# Patient Record
Sex: Female | Born: 1970 | Race: White | Hispanic: No | Marital: Married | Smoking: Former smoker
Health system: Southern US, Community
[De-identification: ages and names within clinical notes are randomized; demographics above are authoritative.]

## PROBLEM LIST (undated history)

## (undated) DIAGNOSIS — T07XXXA Unspecified multiple injuries, initial encounter: Secondary | ICD-10-CM

## (undated) HISTORY — PX: MOLE REMOVAL: SHX2046

## (undated) HISTORY — DX: Unspecified multiple injuries, initial encounter: T07.XXXA

---

## 2006-01-25 ENCOUNTER — Ambulatory Visit: Payer: Self-pay | Admitting: Internal Medicine

## 2006-01-25 ENCOUNTER — Encounter (INDEPENDENT_AMBULATORY_CARE_PROVIDER_SITE_OTHER): Payer: Self-pay | Admitting: *Deleted

## 2018-03-14 ENCOUNTER — Encounter (HOSPITAL_COMMUNITY): Payer: Self-pay | Admitting: Emergency Medicine

## 2018-03-14 ENCOUNTER — Emergency Department (HOSPITAL_COMMUNITY)
Admission: EM | Admit: 2018-03-14 | Discharge: 2018-03-14 | Disposition: A | Discharge status: Home / Self Care | Payer: BC Managed Care – PPO | Attending: Emergency Medicine | Admitting: Emergency Medicine

## 2018-03-14 ENCOUNTER — Other Ambulatory Visit: Payer: Self-pay

## 2018-03-14 ENCOUNTER — Emergency Department (HOSPITAL_COMMUNITY): Payer: BC Managed Care – PPO

## 2018-03-14 DIAGNOSIS — Y929 Unspecified place or not applicable: Secondary | ICD-10-CM | POA: Diagnosis not present

## 2018-03-14 DIAGNOSIS — Z87891 Personal history of nicotine dependence: Secondary | ICD-10-CM | POA: Diagnosis not present

## 2018-03-14 DIAGNOSIS — S99921A Unspecified injury of right foot, initial encounter: Secondary | ICD-10-CM | POA: Diagnosis present

## 2018-03-14 DIAGNOSIS — Y999 Unspecified external cause status: Secondary | ICD-10-CM | POA: Insufficient documentation

## 2018-03-14 DIAGNOSIS — Y939 Activity, unspecified: Secondary | ICD-10-CM | POA: Insufficient documentation

## 2018-03-14 DIAGNOSIS — S92354A Nondisplaced fracture of fifth metatarsal bone, right foot, initial encounter for closed fracture: Secondary | ICD-10-CM | POA: Insufficient documentation

## 2018-03-14 DIAGNOSIS — X509XXA Other and unspecified overexertion or strenuous movements or postures, initial encounter: Secondary | ICD-10-CM | POA: Diagnosis not present

## 2018-03-14 MED ORDER — IBUPROFEN 600 MG PO TABS: 600.0000 mg | ORAL_TABLET | Freq: Four times a day (QID) | ORAL | 0 refills | Status: DC | PRN

## 2018-03-14 MED ORDER — HYDROCODONE-ACETAMINOPHEN 5-325 MG PO TABS: 1.0000 | ORAL_TABLET | Freq: Four times a day (QID) | ORAL | 0 refills | Status: DC | PRN

## 2018-03-14 NOTE — Discharge Instructions (Addendum)
Wear the boot at all time when standing and walking as discussed. Elevate and use ice for the next several days as much as possible to help minimize pain and swelling.  Take the ibuprofen as needed for pain.  You may also take the hydrocodone as prescribed for increased pain relief but this medicine will make you drowsy. Do not drive within 4 hours of taking this medicine.

## 2018-03-14 NOTE — ED Triage Notes (Signed)
Pt reports tripping today and now having R lateral foot pain and swelling.

## 2018-03-16 NOTE — ED Provider Notes (Signed)
[facility] EMERGENCY DEPARTMENT Provider Note   CSN: [id number] Arrival date & time: 03/14/18  1225     History   Chief Complaint Chief Complaint  Patient presents with  . Foot Pain    HPI Crystal Maldonado is a 47 y.o. female presenting with right foot pain which occurred suddenly when the patient tripped and fell, twisting the extremity.  Pain is aching, constant and worse with palpation, movement and weight bearing.  The patient was able to weight bear immediately after the event.  There is no radiation of pain and the patient denies numbness distal to the injury site.  She has had no treatment prior to arrival. .  The history is provided by the patient.    History reviewed. No pertinent past medical history.  There are no active problems to display for this patient.   History reviewed. No pertinent surgical history.   OB History   None      Home Medications    Prior to Admission medications   Medication Sig Start Date End Date Taking? Authorizing Provider  HYDROcodone-acetaminophen (NORCO/VICODIN) 5-325 MG tablet Take 1 tablet by mouth every 6 (six) hours as needed for moderate pain or severe pain. 03/14/18   Burgess AmorIdol, Linetta Regner, PA-C  ibuprofen (ADVIL,MOTRIN) 600 MG tablet Take 1 tablet (600 mg total) by mouth every 6 (six) hours as needed. 03/14/18   Burgess AmorIdol, Nathanial Arrighi, PA-C    Family History History reviewed. No pertinent family history.  Social History Social History   Tobacco Use  . Smoking status: Former Games developermoker  . Smokeless tobacco: Never Used  Substance Use Topics  . Alcohol use: Yes    Comment: q other day 1 drink  . Drug use: Not Currently     Allergies   Patient has no known allergies.   Review of Systems Review of Systems  Constitutional: Negative for fever.  Musculoskeletal: Positive for arthralgias and joint swelling. Negative for myalgias.  Skin: Negative for color change and wound.  Neurological: Negative for weakness and numbness.     Physical  Exam Updated Vital Signs BP 137/70 (BP Location: Right Arm)   Pulse 97   Temp 98.6 F (37 C) (Oral)   Resp 17   Ht 5\' 7"  (1.702 m)   Wt 81.6 kg   LMP 03/07/2018   SpO2 100%   BMI 28.19 kg/m   Physical Exam  Constitutional: She appears well-developed and well-nourished.  HENT:  Head: Atraumatic.  Neck: Normal range of motion.  Cardiovascular:  Pulses equal bilaterally  Musculoskeletal: She exhibits tenderness.       Right foot: There is bony tenderness and swelling. There is normal capillary refill, no crepitus and no deformity.       Feet:  ttp with edema right proximal 5th metatarsal. Distal sensation intact.  Less than 2 sec cap refills in toes.  Dorsalis pedis pulse full. No skin trauma.  Proximal fibula and ankle nontender.  Neurological: She is alert. She has normal strength. She displays normal reflexes. No sensory deficit.  Skin: Skin is warm and dry.  Psychiatric: She has a normal mood and affect.     ED Treatments / Results  Labs (all labs ordered are listed, but only abnormal results are displayed) Labs Reviewed - No data to display  EKG None  Radiology No results found for this or any previous visit. Dg Foot Complete Right  Result Date: 03/14/2018 CLINICAL DATA:  Fall. Tripped on curb. Pain and swelling in the lateral foot. EXAM: RIGHT  FOOT COMPLETE - 3+ VIEW COMPARISON:  None. FINDINGS: A nondisplaced avulsion fracture is present at the base of the fifth metatarsal. Associated soft tissue swelling is present. No additional fractures are present. No radiopaque foreign body is present. IMPRESSION: Nondisplaced avulsion fracture at the base of the fifth metatarsal with associated soft tissue swelling Yetta Barre fracture) Electronically Signed   By: Marin Roberts M.D.   On: 03/14/2018 13:15     Procedures Procedures (including critical care time)  Medications Ordered in ED Medications - No data to display   Initial Impression / Assessment and Plan /  ED Course  I have reviewed the triage vital signs and the nursing notes.  Pertinent labs & imaging results that were available during my care of the patient were reviewed by me and considered in my medical decision making (see chart for details).     Imaging reviewed and discussed with pt.  She was placed in a cam walker, discussed ice, elevation, f/u care, referral given.  Ibuprofen, hydrocodone, [place] controlled substance database reviewed.   Final Clinical Impressions(s) / ED Diagnoses   Final diagnoses:  Closed nondisplaced fracture of fifth metatarsal bone of right foot, initial encounter    ED Discharge Orders         Ordered    HYDROcodone-acetaminophen (NORCO/VICODIN) 5-325 MG tablet  Every 6 hours PRN     03/14/18 1439    ibuprofen (ADVIL,MOTRIN) 600 MG tablet  Every 6 hours PRN     03/14/18 1439           Burgess Amor, PA-C 03/16/18 1716    Bethann Berkshire, MD 03/17/18 [phone]

## 2018-03-26 ENCOUNTER — Ambulatory Visit: Payer: BC Managed Care – PPO | Admitting: Orthopaedic Surgery

## 2018-03-26 ENCOUNTER — Encounter: Payer: Self-pay | Admitting: Orthopaedic Surgery

## 2018-03-26 VITALS — BP 130/88 | HR 79 | Ht 67.0 in | Wt 193.0 lb

## 2018-03-26 DIAGNOSIS — S92351A Displaced fracture of fifth metatarsal bone, right foot, initial encounter for closed fracture: Secondary | ICD-10-CM

## 2018-03-26 NOTE — Progress Notes (Signed)
Subjective:    Patient ID: Crystal Maldonado, female    DOB: 1971/03/21, 47 y.o.   MRN: [id number]  HPI She twisted her foot in a parking lot about 10 days ago and hurt her lateral right foot.  She was seen in the ER on 03-14-18.  X-rays were done and showed: IMPRESSION: Nondisplaced avulsion fracture at the base of the fifth metatarsal with associated soft tissue swelling (Jones fracture)  She was given a CAM walker.  She is doing well.  She has no other injury.  She is walking well with the CAM walker.  Review of Systems  Constitutional: Positive for activity change.  Musculoskeletal: Positive for arthralgias, gait problem and joint swelling.  All other systems reviewed and are negative.  For Review of Systems, all other systems reviewed and are negative.  The following is a summary of the past history medically, past history surgically, known current medicines, social history and family history.  This information is gathered electronically by the computer from prior information and documentation.  I review this each visit and have found including this information at this point in the chart is beneficial and informative.   Past Medical History:  Diagnosis Date  . Fractures     Past Surgical History:  Procedure Laterality Date  . MOLE REMOVAL      Current Outpatient Medications on File Prior to Visit  Medication Sig Dispense Refill  . HYDROcodone-acetaminophen (NORCO/VICODIN) 5-325 MG tablet Take 1 tablet by mouth every 6 (six) hours as needed for moderate pain or severe pain. 15 tablet 0  . ibuprofen (ADVIL,MOTRIN) 600 MG tablet Take 1 tablet (600 mg total) by mouth every 6 (six) hours as needed. 30 tablet 0   No current facility-administered medications on file prior to visit.     Social History   Socioeconomic History  . Marital status: Married    Spouse name: Not on file  . Number of children: Not on file  . Years of education: Not on file  . Highest education level: Not  on file  Occupational History  . Not on file  Social Needs  . Financial resource strain: Not on file  . Food insecurity:    Worry: Not on file    Inability: Not on file  . Transportation needs:    Medical: Not on file    Non-medical: Not on file  Tobacco Use  . Smoking status: Former Games developer  . Smokeless tobacco: Never Used  Substance and Sexual Activity  . Alcohol use: Yes    Comment: q other day 1 drink  . Drug use: Not Currently  . Sexual activity: Not on file  Lifestyle  . Physical activity:    Days per week: Not on file    Minutes per session: Not on file  . Stress: Not on file  Relationships  . Social connections:    Talks on phone: Not on file    Gets together: Not on file    Attends religious service: Not on file    Active member of club or organization: Not on file    Attends meetings of clubs or organizations: Not on file    Relationship status: Not on file  . Intimate partner violence:    Fear of current or ex partner: Not on file    Emotionally abused: Not on file    Physically abused: Not on file    Forced sexual activity: Not on file  Other Topics Concern  . Not on file  Social History Narrative  . Not on file    Family History  Problem Relation Age of Onset  . Liver disease Mother   . High blood pressure Mother   . High blood pressure Father     BP 130/88   Pulse 79   Ht 5\' 7"  (1.702 m)   Wt 193 lb (87.5 kg)   LMP 03/07/2018   BMI 30.23 kg/m   Body mass index is 30.23 kg/m.      Objective:   Physical Exam  Constitutional: She is oriented to person, place, and time. She appears well-developed and well-nourished.  HENT:  Head: Normocephalic and atraumatic.  Eyes: Pupils are equal, round, and reactive to light. Conjunctivae and EOM are normal.  Neck: Normal range of motion. Neck supple.  Cardiovascular: Normal rate, regular rhythm and intact distal pulses.  Pulmonary/Chest: Effort normal.  Abdominal: Soft.  Musculoskeletal:        Feet:  Neurological: She is alert and oriented to person, place, and time. She has normal reflexes. She displays normal reflexes. No cranial nerve deficit. She exhibits normal muscle tone. Coordination normal.  Skin: Skin is warm and dry.  Psychiatric: She has a normal mood and affect. Her behavior is normal. Judgment and thought content normal.   I have reviewed the x-rays, x-ray report and ER records.        Assessment & Plan:   Encounter Diagnosis  Name Primary?  . Closed fracture of base of fifth metatarsal bone of right foot, initial encounter Yes   Continue the CAM walker. She may try to bear weight as tolerated around the home.  Return in two weeks.  X-rays then of the right foot.  Call if any problem.  Precautions discussed.   Electronically Signed Darreld Mclean, MD 9/10/20193:09 PM

## 2018-04-09 ENCOUNTER — Ambulatory Visit: Payer: BC Managed Care – PPO | Admitting: Orthopaedic Surgery

## 2018-04-09 ENCOUNTER — Encounter: Payer: Self-pay | Admitting: Orthopaedic Surgery

## 2018-04-09 ENCOUNTER — Ambulatory Visit (INDEPENDENT_AMBULATORY_CARE_PROVIDER_SITE_OTHER): Payer: BC Managed Care – PPO

## 2018-04-09 DIAGNOSIS — S92351D Displaced fracture of fifth metatarsal bone, right foot, subsequent encounter for fracture with routine healing: Secondary | ICD-10-CM

## 2018-04-09 NOTE — Progress Notes (Signed)
CC:  My foot does not hurt  She has been using the CAM walker with no problem.  She has no pain, NV intact, she has no swelling of the right foot.  X-rays were done of the right foot, reported separately.  Encounter Diagnosis  Name Primary?  . Closed fracture of base of fifth metatarsal bone of right foot with routine healing Yes   She can stop the CAM walker.  Precautions discussed.  Return in three weeks.  X-rays on return.  Call if any problem.  Electronically Signed Darreld McleanWayne Kennesha Brewbaker, MD 9/24/20192:52 PM

## 2018-04-30 ENCOUNTER — Ambulatory Visit: Payer: BC Managed Care – PPO | Admitting: Orthopaedic Surgery

## 2018-05-02 ENCOUNTER — Encounter: Payer: Self-pay | Admitting: Orthopaedic Surgery

## 2018-05-02 ENCOUNTER — Ambulatory Visit (INDEPENDENT_AMBULATORY_CARE_PROVIDER_SITE_OTHER): Payer: BC Managed Care – PPO | Admitting: Orthopaedic Surgery

## 2018-05-02 ENCOUNTER — Ambulatory Visit (INDEPENDENT_AMBULATORY_CARE_PROVIDER_SITE_OTHER): Payer: BC Managed Care – PPO

## 2018-05-02 DIAGNOSIS — S92351D Displaced fracture of fifth metatarsal bone, right foot, subsequent encounter for fracture with routine healing: Secondary | ICD-10-CM

## 2018-05-02 NOTE — Progress Notes (Signed)
CC:  My foot is much better  She has just slight tenderness at times of the right foot.  She is walking well and wearing the shoes she would like to wear.  NV intact.  Gait is normal.  X-rays were done of the right foot, reported separately.  Encounter Diagnosis  Name Primary?  . Closed fracture of base of fifth metatarsal bone of right foot with routine healing Yes   Call if any problem.  Precautions discussed.   Electronically Signed Darreld Mclean, MD 10/17/20192:13 PM

## 2019-07-07 IMAGING — DX DG FOOT COMPLETE 3+V*R*
3 series · 3 of 3 positions shown · non-contrast
Comparison: None.

CLINICAL DATA: Fall. Tripped on curb. Pain and swelling in the
lateral foot.

EXAM:
RIGHT FOOT COMPLETE - 3+ VIEW

[foot ap]
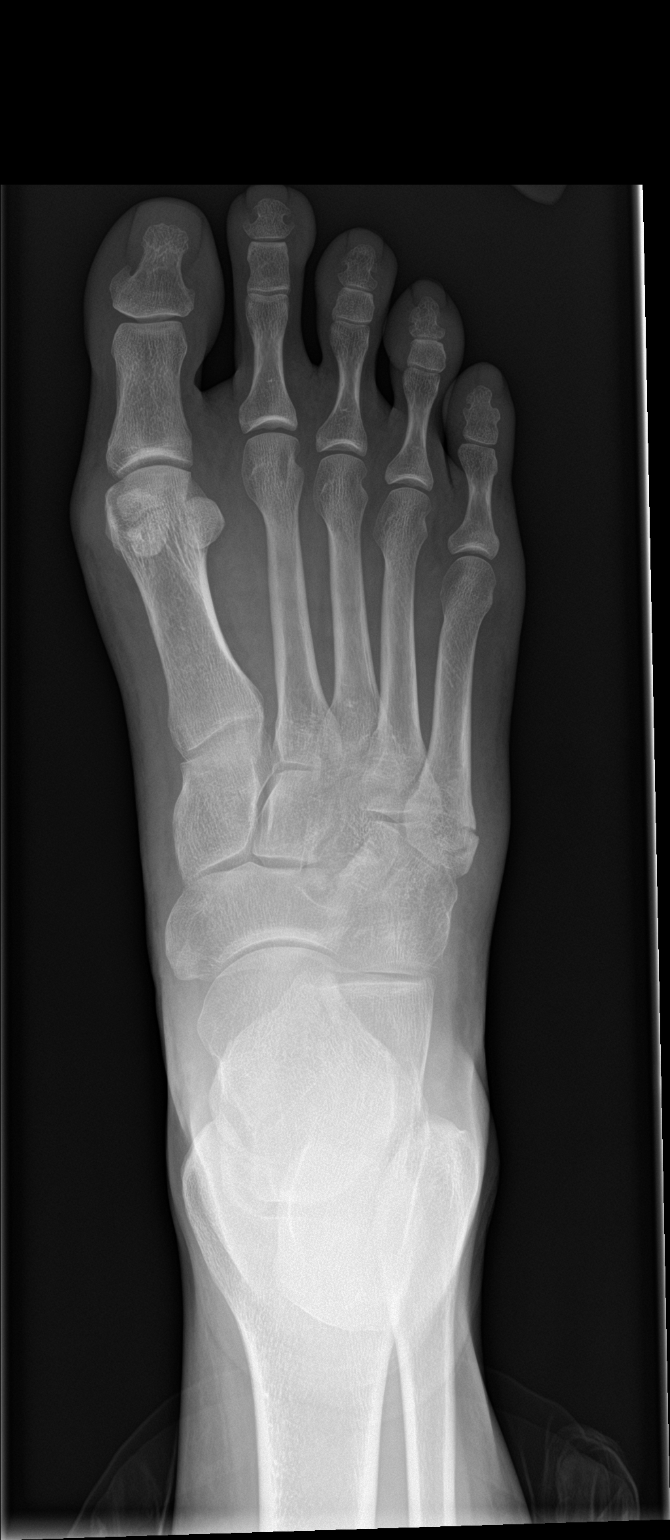

[foot obl]
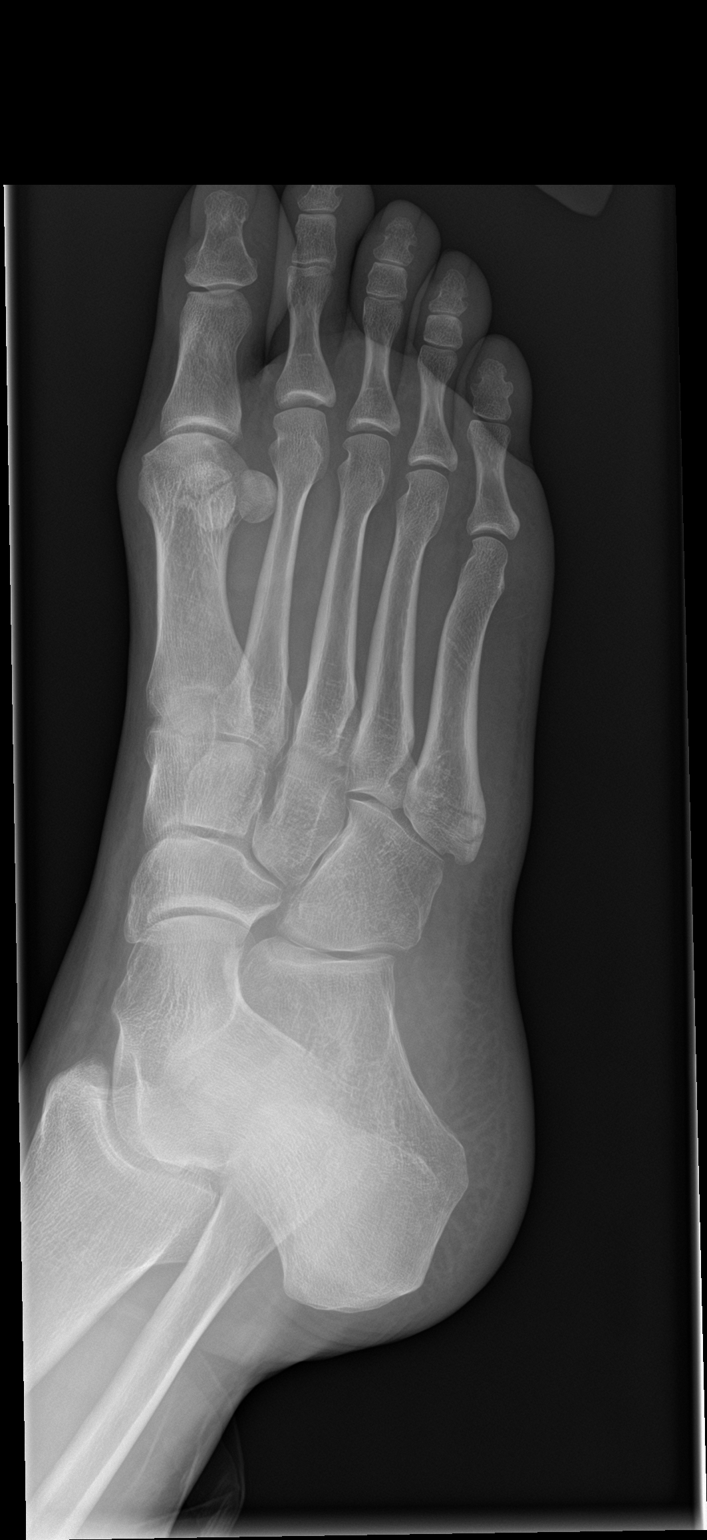

[foot lat]
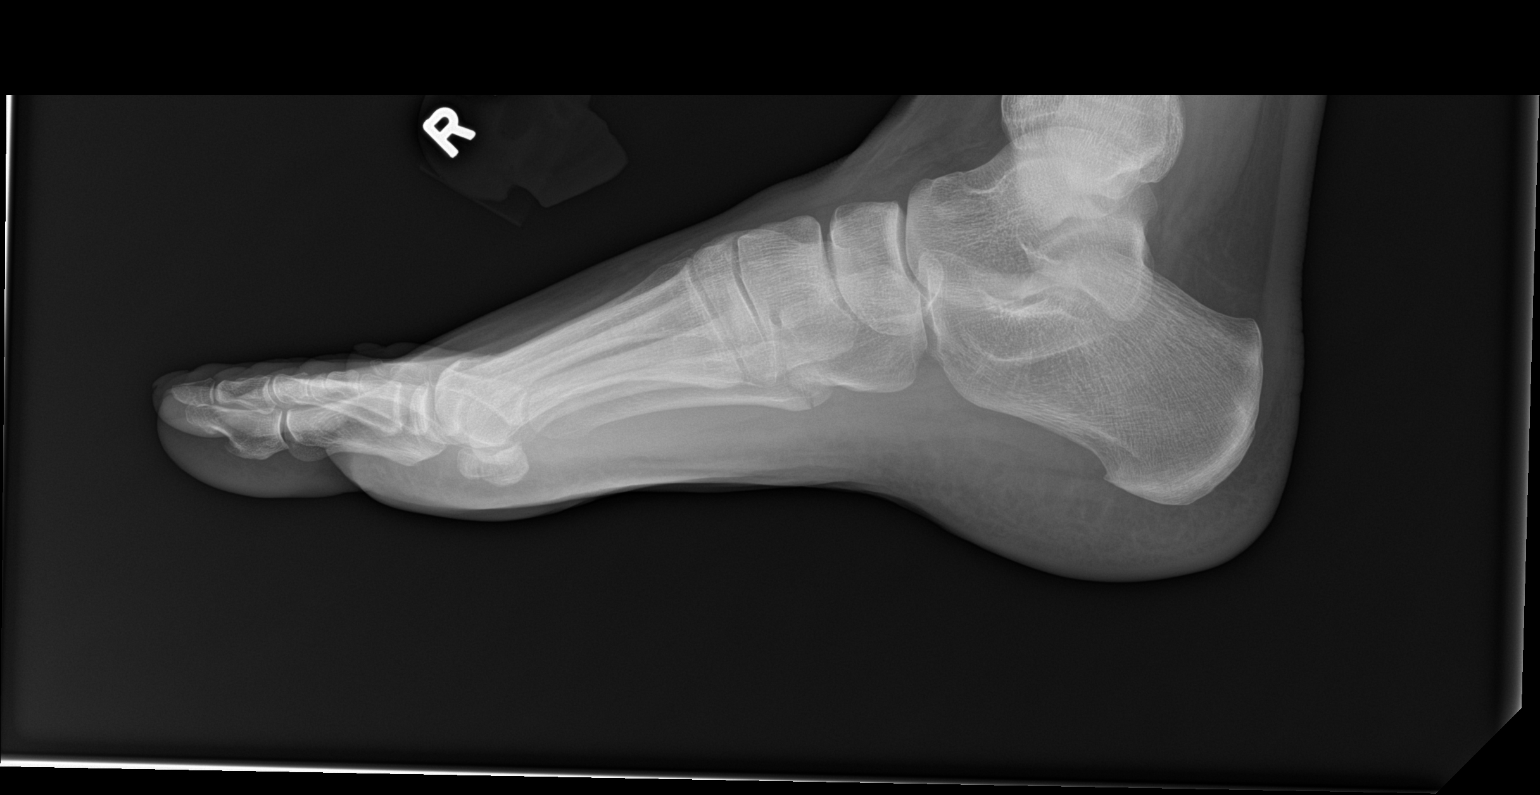

[3 of 3 positions shown; findings below may reference images not displayed]

FINDINGS: A nondisplaced avulsion fracture is present at the base of the fifth
metatarsal. Associated soft tissue swelling is present. No
additional fractures are present. No radiopaque foreign body is
present.
IMPRESSION: Nondisplaced avulsion fracture at the base of the fifth metatarsal
with associated soft tissue swelling (Jones fracture)
